# Patient Record
Sex: Male | Born: 1970 | Hispanic: Yes | Marital: Single | Smoking: Never smoker
Health system: Southern US, Community
[De-identification: ages and names within clinical notes are randomized; demographics above are authoritative.]

---

## 2015-01-26 ENCOUNTER — Emergency Department: Payer: Self-pay | Admitting: Emergency Medicine

## 2020-02-17 ENCOUNTER — Ambulatory Visit: Payer: Self-pay | Attending: Internal Medicine

## 2020-02-17 ENCOUNTER — Ambulatory Visit: Payer: Self-pay

## 2020-02-17 DIAGNOSIS — Z23 Encounter for immunization: Secondary | ICD-10-CM

## 2020-02-17 NOTE — Progress Notes (Signed)
   Covid-19 Vaccination Clinic  Name:  Jesse Travis    MRN: [id number] DOB: 20-Apr-1971  02/17/2020  Mr. Audel Coakley was observed post Covid-19 immunization for 15 minutes without incident. He was provided with Vaccine Information Sheet and instruction to access the V-Safe system.   Mr. Carsten Carstarphen was instructed to call 911 with any severe reactions post vaccine: Marland Kitchen Difficulty breathing  . Swelling of face and throat  . A fast heartbeat  . A bad rash all over body  . Dizziness and weakness   Immunizations Administered    Name Date Dose VIS Date Route   Pfizer COVID-19 Vaccine 02/17/2020 10:29 AM 0.3 mL 10/27/2019 Intramuscular   Manufacturer: [employer], [employer]   Lot: [id number]   NDC: [address]-3

## 2020-03-09 ENCOUNTER — Ambulatory Visit: Payer: Self-pay | Attending: Internal Medicine

## 2020-03-09 DIAGNOSIS — Z23 Encounter for immunization: Secondary | ICD-10-CM

## 2020-03-09 NOTE — Progress Notes (Signed)
   Covid-19 Vaccination Clinic  Name:  Jesse Travis    MRN: [id number] DOB: 1971/01/06  03/09/2020  Mr. Jesse Travis was observed post Covid-19 immunization for 15 minutes without incident. He was provided with Vaccine Information Sheet and instruction to access the V-Safe system.   Mr. Jesse Travis was instructed to call 911 with any severe reactions post vaccine: Marland Kitchen Difficulty breathing  . Swelling of face and throat  . A fast heartbeat  . A bad rash all over body  . Dizziness and weakness   Immunizations Administered    Name Date Dose VIS Date Route   Pfizer COVID-19 Vaccine 03/09/2020 10:24 AM 0.3 mL 01/10/2019 Intramuscular   Manufacturer: [employer], [employer]   Lot: [id number]   NDC: [address]-0

## 2020-08-23 ENCOUNTER — Emergency Department: Payer: Self-pay

## 2020-08-23 ENCOUNTER — Encounter: Payer: Self-pay | Admitting: Emergency Medicine

## 2020-08-23 ENCOUNTER — Other Ambulatory Visit: Payer: Self-pay

## 2020-08-23 ENCOUNTER — Emergency Department
Admission: EM | Admit: 2020-08-23 | Discharge: 2020-08-23 | Disposition: A | Discharge status: Home / Self Care | Payer: Self-pay | Attending: Emergency Medicine | Admitting: Emergency Medicine

## 2020-08-23 DIAGNOSIS — R0789 Other chest pain: Secondary | ICD-10-CM | POA: Diagnosis present

## 2020-08-23 DIAGNOSIS — M25512 Pain in left shoulder: Secondary | ICD-10-CM | POA: Diagnosis not present

## 2020-08-23 DIAGNOSIS — M7918 Myalgia, other site: Secondary | ICD-10-CM | POA: Diagnosis not present

## 2020-08-23 MED ORDER — KETOROLAC TROMETHAMINE 30 MG/ML IJ SOLN
30.0000 mg | Freq: Once | INTRAMUSCULAR | Status: AC
  Administered 2020-08-23: 30 mg via INTRAMUSCULAR
  Filled 2020-08-23: qty 1

## 2020-08-23 MED ORDER — KETOROLAC TROMETHAMINE 10 MG PO TABS: 10.0000 mg | ORAL_TABLET | Freq: Three times a day (TID) | ORAL | 0 refills | Status: AC
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
## 2021-07-19 IMAGING — CR DG RIBS W/ CHEST 3+V*R*
1 series · 3 of 3 positions shown · non-contrast
Comparison: Chest radiograph dated 01/26/2015.

CLINICAL DATA: 48-year-old male with right chest wall pain.

EXAM:
RIGHT RIBS AND CHEST - 3+ VIEW

[Series 1: dg ribs unilateral w/chest right · 0.14mm/px · 3 of 3 slices shown]
[im 1/3]
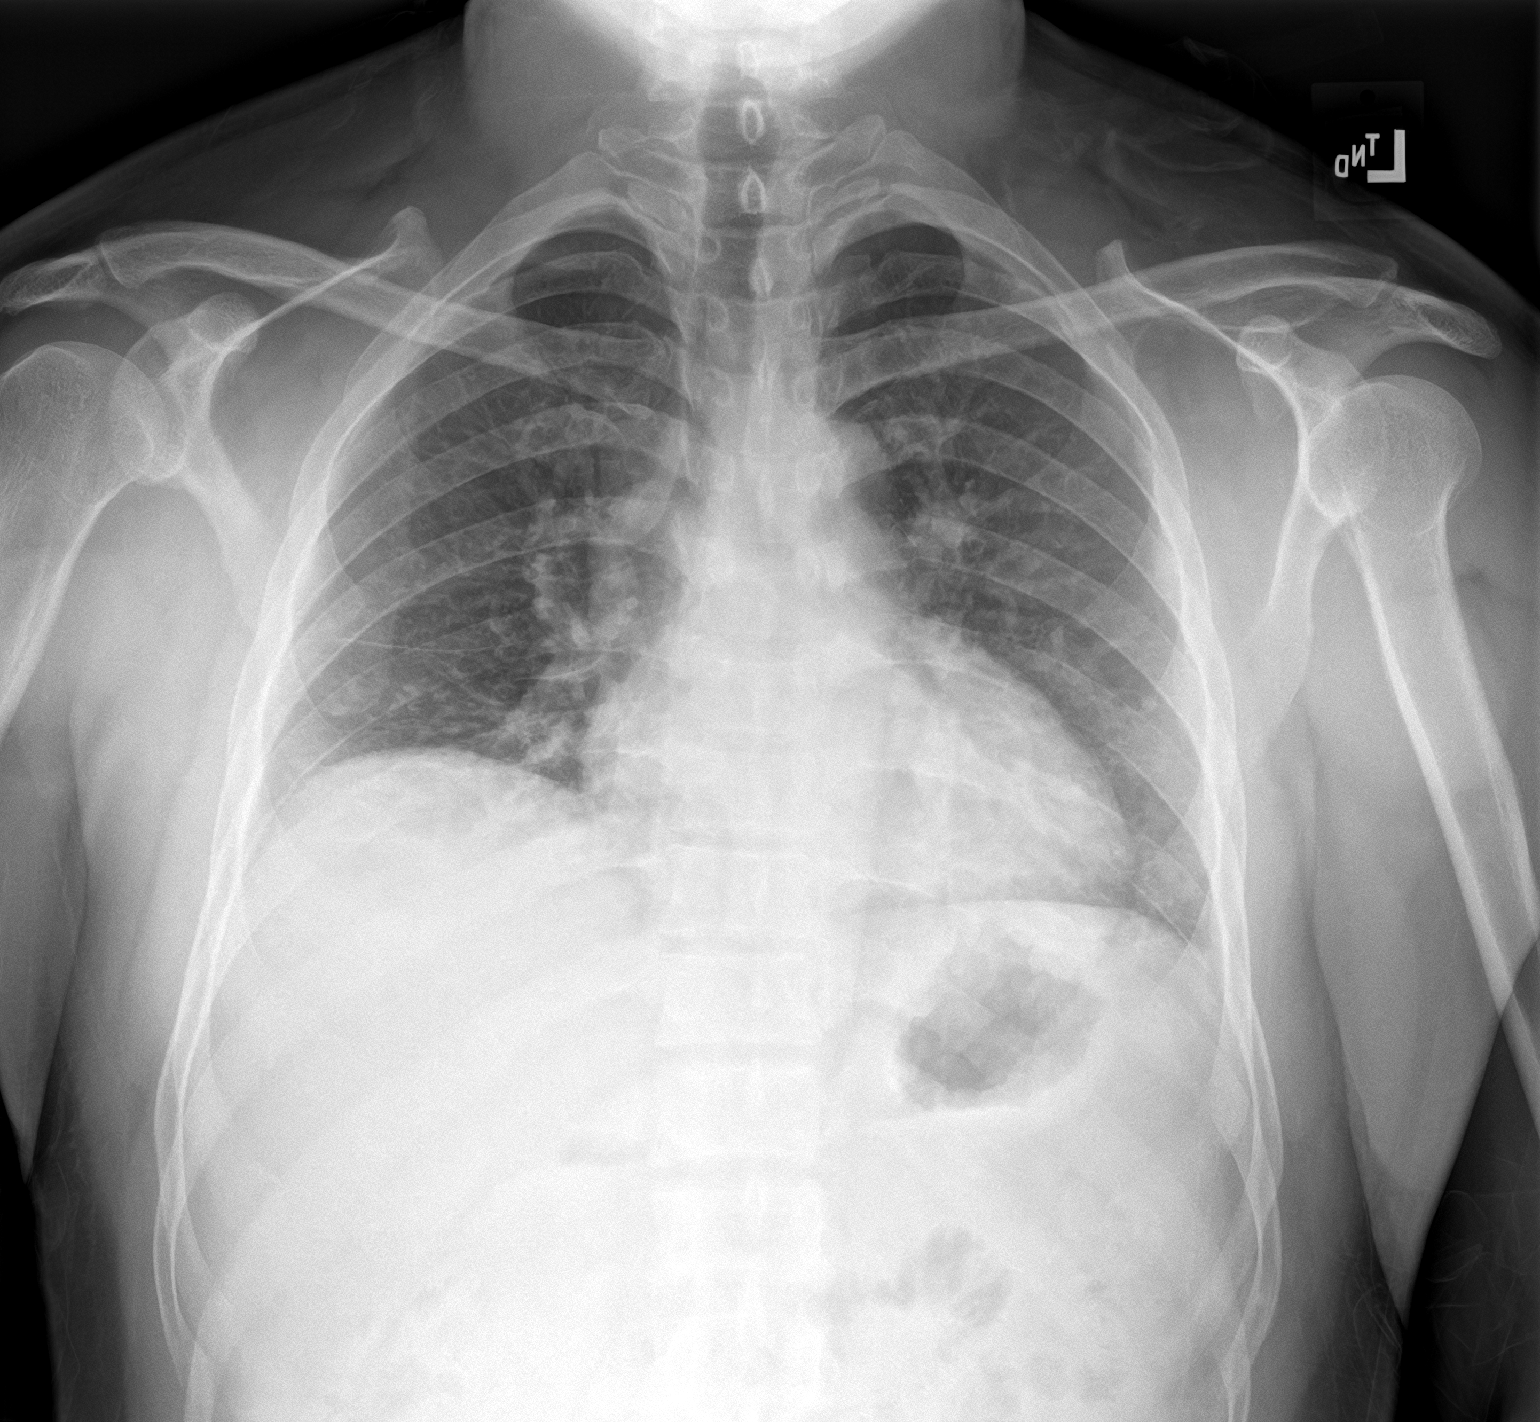
[im 2/3]
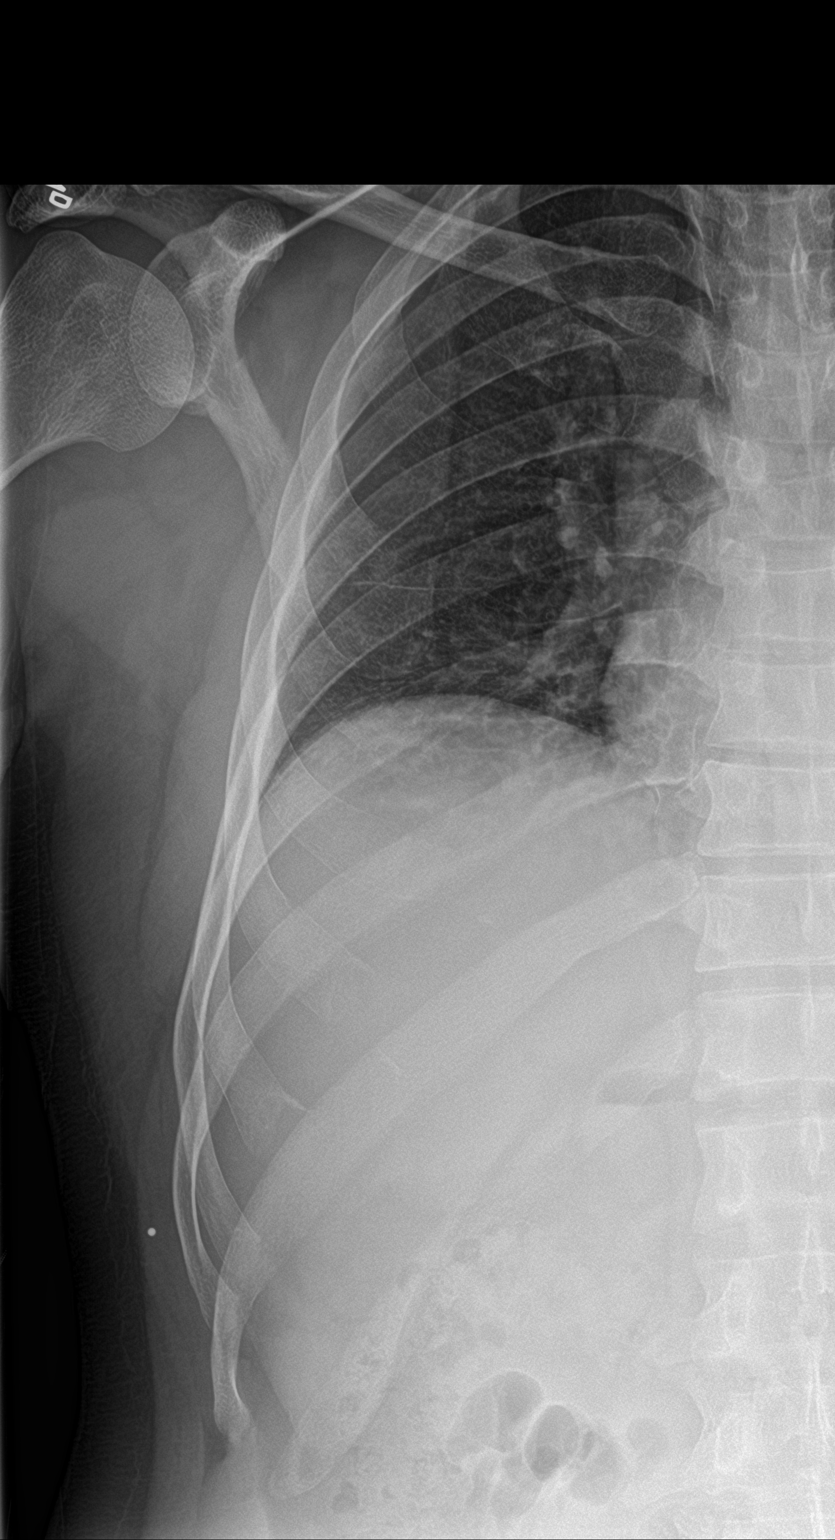
[im 3/3]
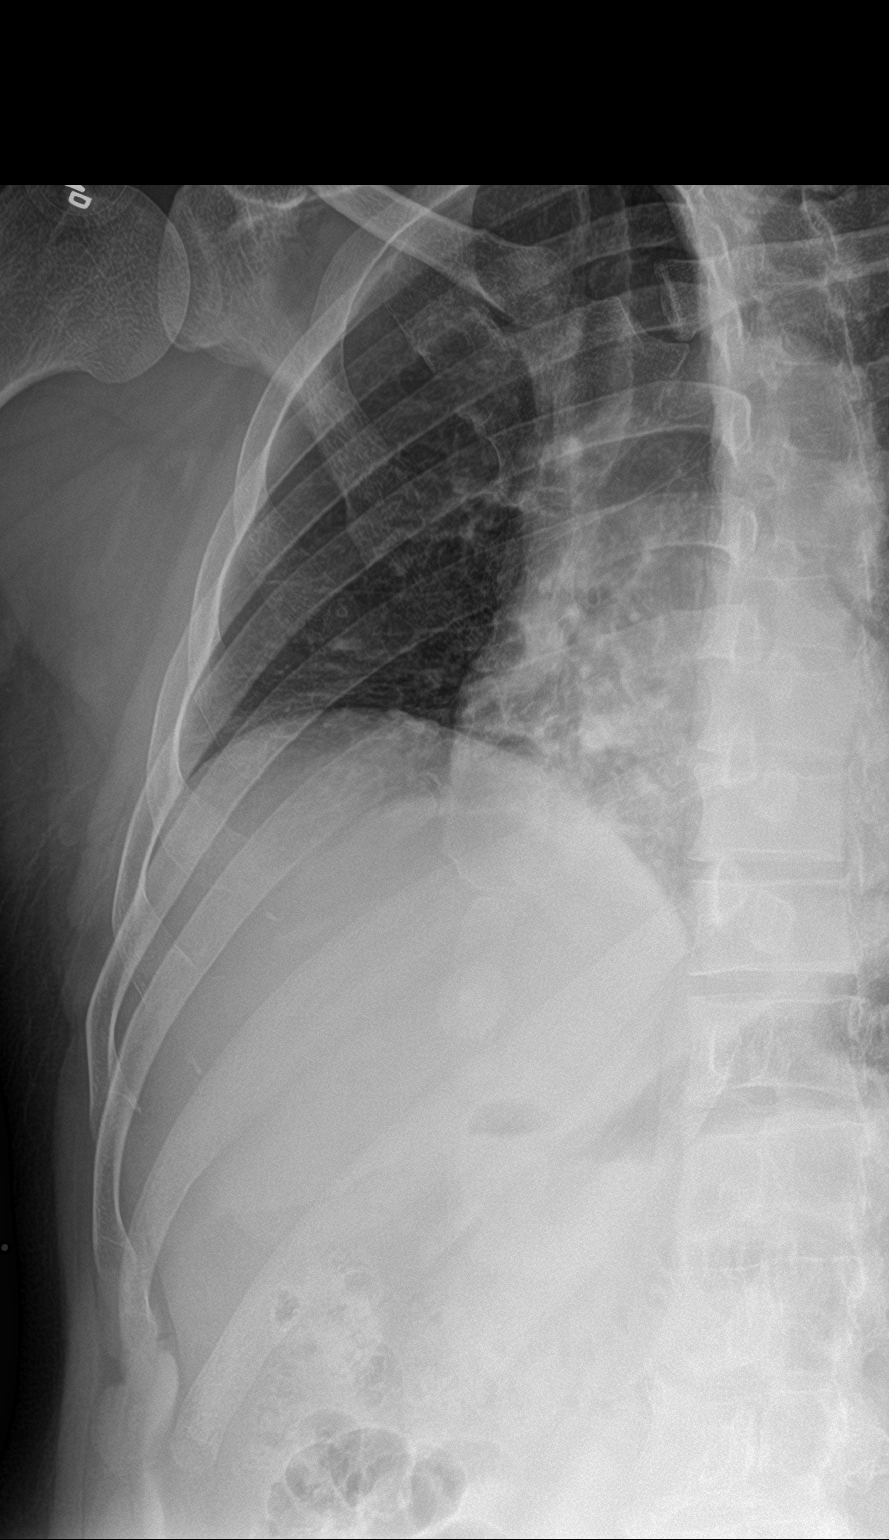

[3 of 3 positions shown; findings below may reference images not displayed]

FINDINGS: Minimal bibasilar atelectasis. No focal consolidation, pleural
effusion, or pneumothorax. The cardiac silhouette is within normal
limits. No acute osseous pathology. No displaced rib fractures.
IMPRESSION: Negative.
